# Patient Record
Sex: Male | Born: 1965 | Race: White | Hispanic: No | Marital: Married | Smoking: Never smoker
Health system: Southern US, Community
[De-identification: ages and names within clinical notes are randomized; demographics above are authoritative.]

## PROBLEM LIST (undated history)

## (undated) DIAGNOSIS — I10 Essential (primary) hypertension: Secondary | ICD-10-CM

## (undated) HISTORY — PX: NO PAST SURGERIES: SHX2092

## (undated) HISTORY — DX: Essential (primary) hypertension: I10

---

## 2014-12-08 ENCOUNTER — Ambulatory Visit (INDEPENDENT_AMBULATORY_CARE_PROVIDER_SITE_OTHER): Payer: Managed Care, Other (non HMO) | Admitting: Sports Medicine

## 2014-12-08 ENCOUNTER — Encounter: Payer: Self-pay | Admitting: Sports Medicine

## 2014-12-08 ENCOUNTER — Ambulatory Visit (INDEPENDENT_AMBULATORY_CARE_PROVIDER_SITE_OTHER): Payer: Managed Care, Other (non HMO)

## 2014-12-08 VITALS — BP 138/85 | HR 77 | Wt 213.0 lb

## 2014-12-08 DIAGNOSIS — M545 Low back pain, unspecified: Secondary | ICD-10-CM

## 2014-12-08 DIAGNOSIS — M546 Pain in thoracic spine: Secondary | ICD-10-CM | POA: Diagnosis not present

## 2014-12-08 DIAGNOSIS — M4806 Spinal stenosis, lumbar region: Secondary | ICD-10-CM | POA: Diagnosis not present

## 2014-12-08 DIAGNOSIS — M5136 Other intervertebral disc degeneration, lumbar region: Secondary | ICD-10-CM | POA: Insufficient documentation

## 2014-12-08 DIAGNOSIS — M51369 Other intervertebral disc degeneration, lumbar region without mention of lumbar back pain or lower extremity pain: Secondary | ICD-10-CM | POA: Insufficient documentation

## 2014-12-08 MED ORDER — MELOXICAM 15 MG PO TABS: ORAL_TABLET | ORAL | Status: DC

## 2014-12-08 MED ORDER — PREDNISONE 50 MG PO TABS: ORAL_TABLET | ORAL | Status: DC

## 2014-12-08 NOTE — Assessment & Plan Note (Signed)
Discogenic type back pain.  No red flags We will start conservatively with x-rays, prednisone, meloxicam, home rehabilitation exercises. Return in one month, MRI for intervention if no better.

## 2014-12-08 NOTE — Progress Notes (Signed)
   Subjective:    I'm seeing this patient as a consultation for:  Shawn Dicker PA-C  CC: Low back pain  HPI: This is a pleasant 49 year old male, for years he's had pain he localizes in the midline of his low back, no radiation, worse with flexion, Valsalva. No bowel or bladder dysfunction, nothing radicular, no saddle numbness, no constitutional symptoms. Moderate, persistent.  Past medical history, Surgical history, Family history not pertinant except as noted below, Social history, Allergies, and medications have been entered into the medical record, reviewed, and no changes needed.   Review of Systems: No headache, visual changes, nausea, vomiting, diarrhea, constipation, dizziness, abdominal pain, skin rash, fevers, chills, night sweats, weight loss, swollen lymph nodes, body aches, joint swelling, muscle aches, chest pain, shortness of breath, mood changes, visual or auditory hallucinations.   Objective:   General: Well Developed, well nourished, and in no acute distress.  Neuro/Psych: Alert and oriented x3, extra-ocular muscles intact, able to move all 4 extremities, sensation grossly intact. Skin: Warm and dry, no rashes noted.  Respiratory: Not using accessory muscles, speaking in full sentences, trachea midline.  Cardiovascular: Pulses palpable, no extremity edema. Abdomen: Does not appear distended. Back Exam:  Inspection: Unremarkable  Motion: Flexion 45 deg, Extension 45 deg, Side Bending to 45 deg bilaterally,  Rotation to 45 deg bilaterally  SLR laying: Negative  XSLR laying: Negative  Palpable tenderness: None. FABER: negative. Sensory change: Gross sensation intact to all lumbar and sacral dermatomes.  Reflexes: 2+ at both patellar tendons, 2+ at achilles tendons, Babinski's downgoing.  Strength at foot  Plantar-flexion: 5/5 Dorsi-flexion: 5/5 Eversion: 5/5 Inversion: 5/5  Leg strength  Quad: 5/5 Hamstring: 5/5 Hip flexor: 5/5 Hip abductors: 5/5  Gait  unremarkable.  Impression and Recommendations:   This case required medical decision making of moderate complexity.

## 2015-01-05 ENCOUNTER — Ambulatory Visit (INDEPENDENT_AMBULATORY_CARE_PROVIDER_SITE_OTHER): Payer: Managed Care, Other (non HMO) | Admitting: Sports Medicine

## 2015-01-05 ENCOUNTER — Encounter: Payer: Self-pay | Admitting: Sports Medicine

## 2015-01-05 VITALS — BP 132/89 | HR 85 | Ht 67.0 in | Wt 207.0 lb

## 2015-01-05 DIAGNOSIS — M545 Low back pain, unspecified: Secondary | ICD-10-CM

## 2015-01-05 NOTE — Assessment & Plan Note (Signed)
Essentially resolved with prednisone, has not yet done the rehabilitation exercises are taken the meloxicam.  Return as needed.

## 2015-01-05 NOTE — Progress Notes (Signed)
  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 He is happy with how things are going so far and will call us back if he has any worsening of symptoms.  Past medical history, Surgical history, Family history not pertinant except as noted below, Social history, Allergies, and medications have been entered into the medical record, reviewed, and no changes needed.   Review of Systems: No fevers, chills, night sweats, weight loss, chest pain, or shortness of breath.   Objective:    General: Well Developed, well nourished, and in no acute distress.  Neuro: Alert and oriented x3, extra-ocular muscles intact, sensation grossly intact.  HEENT: Normocephalic, atraumatic, pupils equal round reactive to light, neck supple, no masses, no lymphadenopathy, thyroid nonpalpable.  Skin: Warm and dry, no rashes. Cardiac: Regular rate and rhythm, no murmurs rubs or gallops, no lower extremity edema.  Respiratory: Clear to auscultation bilaterally. Not using accessory muscles, speaking in full sentences. Back Exam:  Inspection: Unremarkable  Motion: Flexion 45 deg, Extension 45 deg, Side Bending to 45 deg bilaterally,  Rotation to 45 deg bilaterally  SLR laying: Negative  XSLR laying: Negative  Palpable tenderness: None. FABER: negative. Sensory change: Gross sensation intact to all lumbar and sacral dermatomes.  Reflexes: 2+ at both patellar tendons, 2+ at achilles tendons, Babinski's downgoing.  Strength at foot  Plantar-flexion: 5/5 Dorsi-flexion: 5/5 Eversion: 5/5 Inversion: 5/5  Leg strength  Quad: 5/5 Hamstring: 5/5 Hip flexor: 5/5 Hip abductors: 5/5  Gait unremarkable.  Impression and Recommendations:

## 2017-04-21 IMAGING — CR DG THORACIC SPINE 3V
4 series · 4 of 4 positions shown · non-contrast
Comparison: None in PACs

CLINICAL DATA: Mid lower back pain for many years without known
injury

EXAM:
THORACIC SPINE - 3 VIEWS

[t-spine ap]
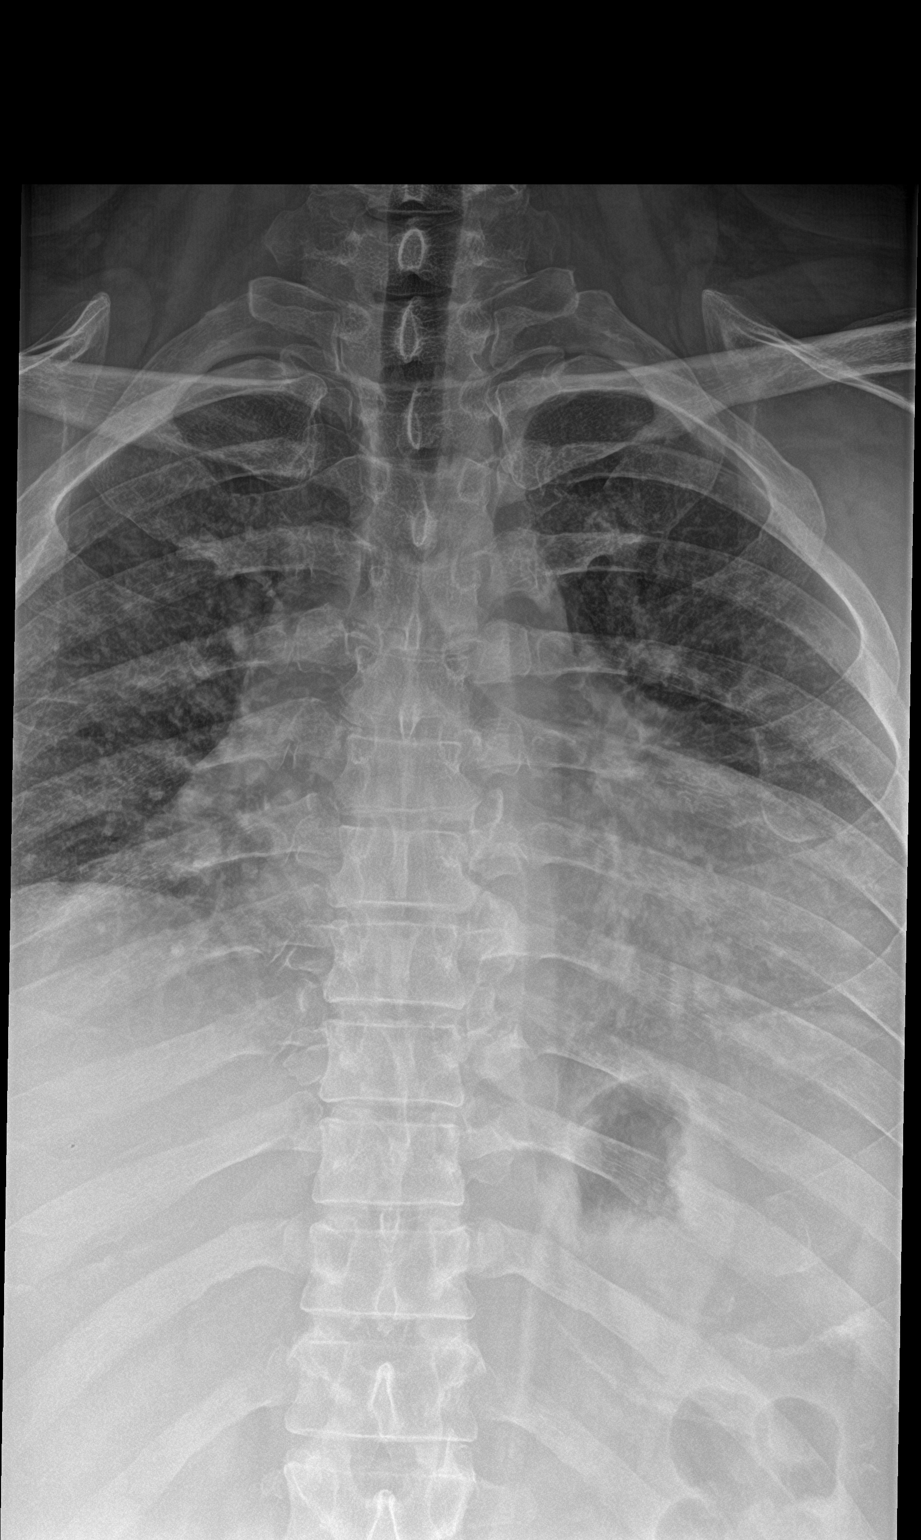

[t-spine lat]
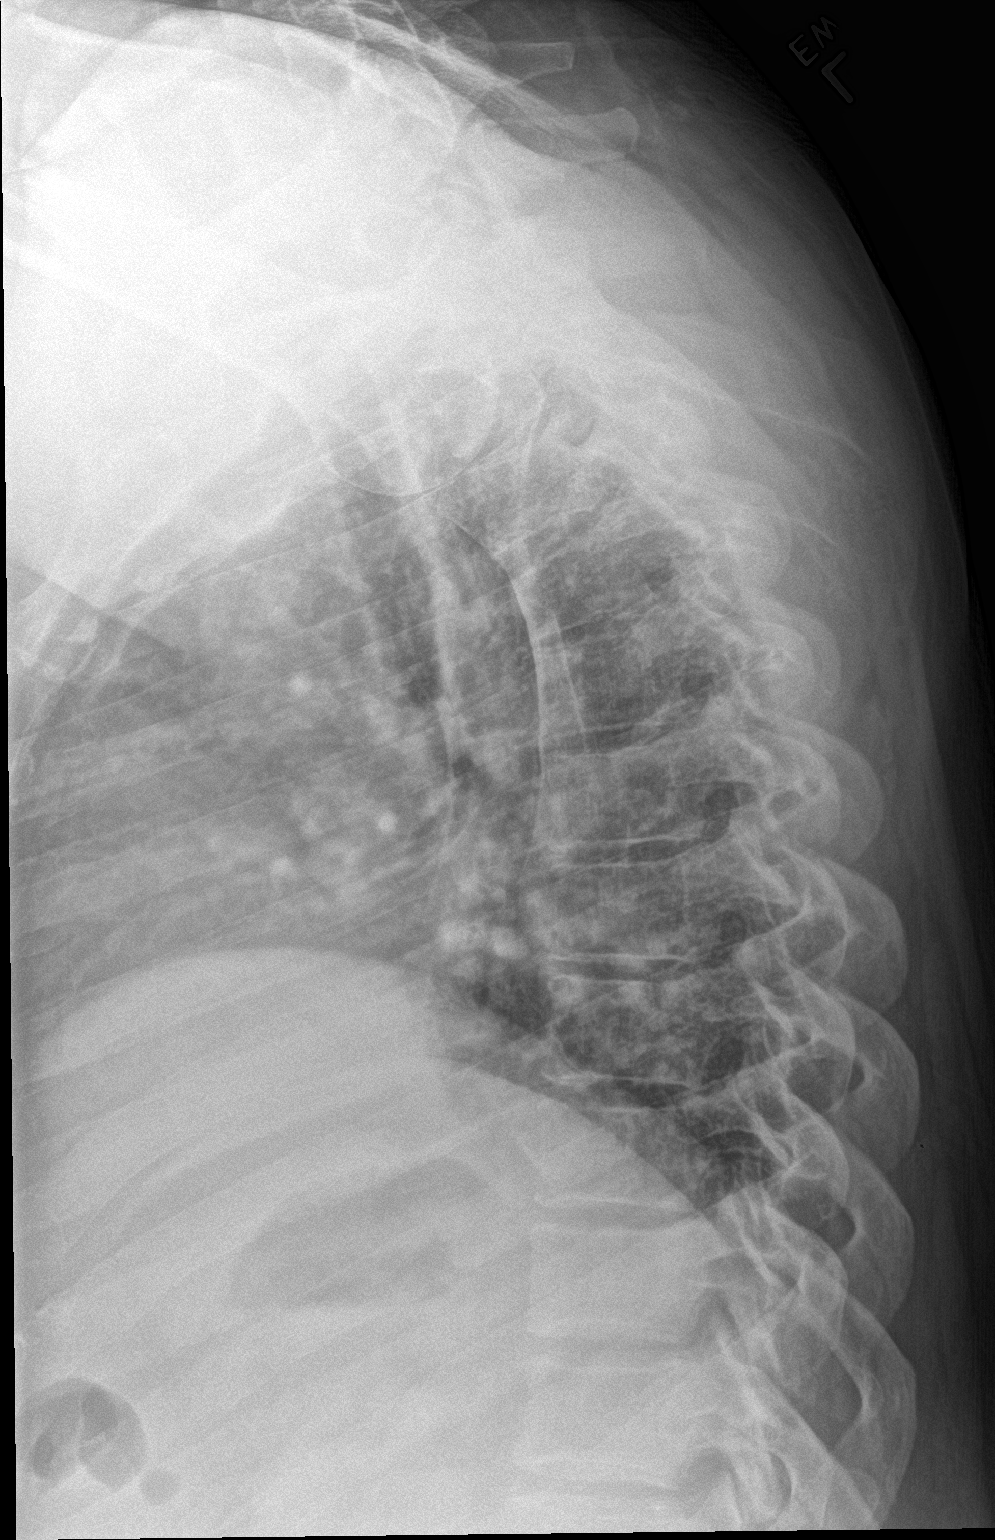

[t-spine swimmers (1 of 2)]
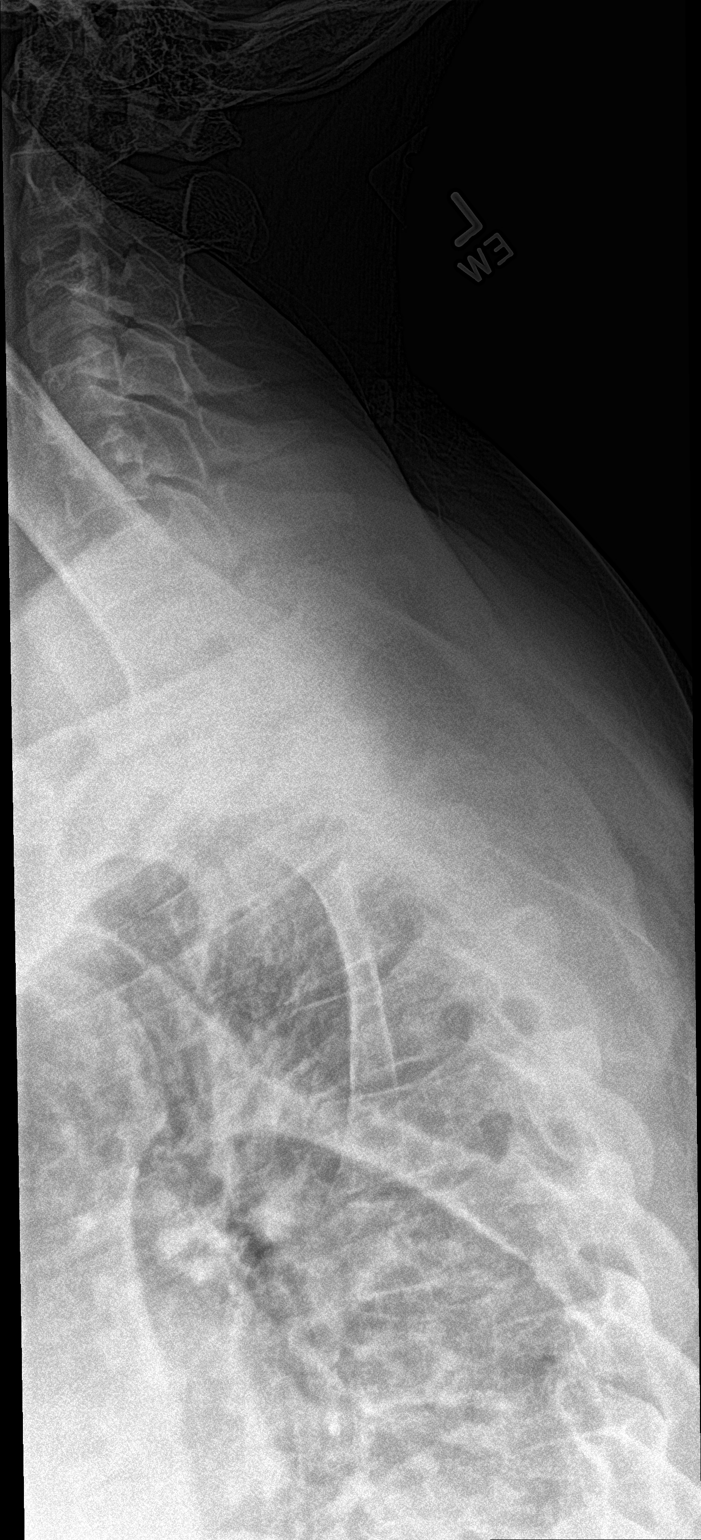

[t-spine swimmers (2 of 2)]
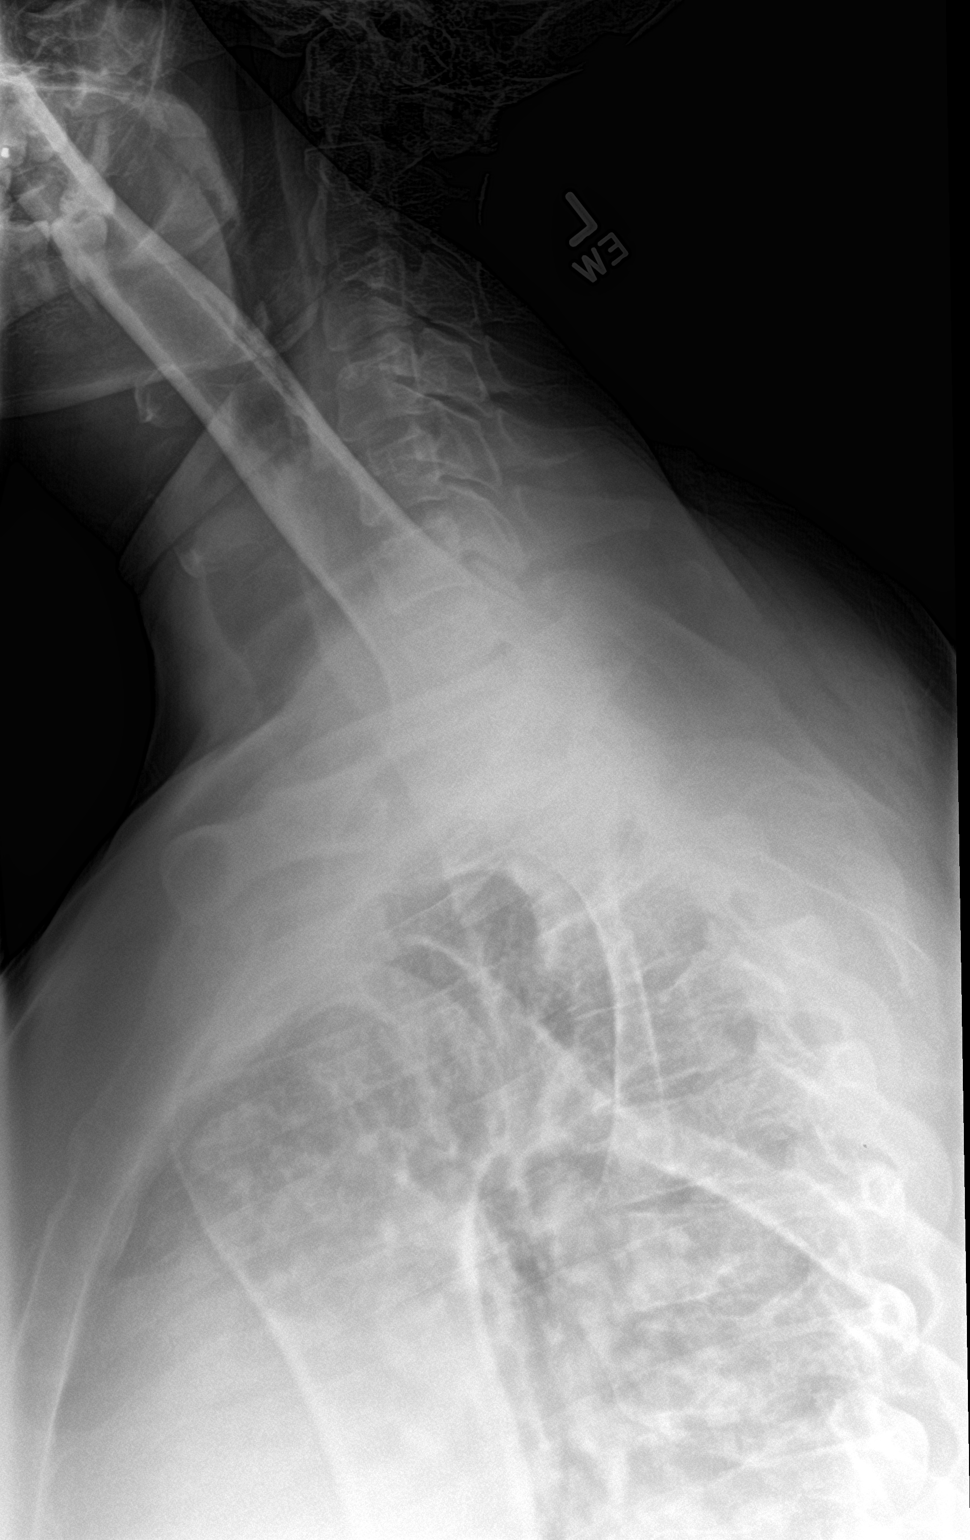

[4 of 4 positions shown; findings below may reference images not displayed]

FINDINGS: The thoracic vertebral bodies are preserved in height. The disc
space heights are reasonably well-maintained. Limited visualization
of the cervicothoracic junction reveals no acute abnormality. There
are no abnormal paravertebral soft tissue densities. The pedicles
are intact.
IMPRESSION: There is no acute or significant chronic bony abnormality of the
thoracic spine.

## 2020-10-28 ENCOUNTER — Ambulatory Visit: Payer: Managed Care, Other (non HMO) | Admitting: Sports Medicine

## 2020-10-28 ENCOUNTER — Ambulatory Visit (INDEPENDENT_AMBULATORY_CARE_PROVIDER_SITE_OTHER): Payer: Managed Care, Other (non HMO)

## 2020-10-28 ENCOUNTER — Other Ambulatory Visit: Payer: Self-pay

## 2020-10-28 DIAGNOSIS — M5136 Other intervertebral disc degeneration, lumbar region: Secondary | ICD-10-CM

## 2020-10-28 DIAGNOSIS — G5621 Lesion of ulnar nerve, right upper limb: Secondary | ICD-10-CM | POA: Diagnosis not present

## 2020-10-28 DIAGNOSIS — M51369 Other intervertebral disc degeneration, lumbar region without mention of lumbar back pain or lower extremity pain: Secondary | ICD-10-CM

## 2020-10-28 NOTE — Progress Notes (Signed)
    Procedures performed today:    None.  Independent interpretation of notes and tests performed by another provider:   None.  Brief History, Exam, Impression, and Recommendations:    Lumbar degenerative disc disease Shawn Gilbert is a very pleasant 55 year old male, he has a long history of chronic low back pain, I last saw him in 2016, we treated him conservatively for axial discogenic back pain, he responded well to conditioning exercises. More recently he is having an increase in pain, worse with sitting, flexion, Valsalva. No red flag symptoms, nothing radicular. 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 Updated x-rays. Return to see me 4 weeks as needed at which point we will try prescription strength anti-inflammatory.  Cubital tunnel syndrome, right In addition Shawn Gilbert has been waking up with numbness in his ring finger. This is classically cubital tunnel syndrome, symptoms are minimal, he will work on keeping his elbow straight, adding some ulnar nerve gliding exercises. If insufficient improvement after 4 to 6 weeks we will consider nighttime splinting in extension.    ___________________________________________ Ihor Austin. Benjamin Stain, M.D., ABFM., CAQSM. Primary Care and Sports Medicine [facility]  Adjunct Instructor of Family Medicine  [employer]

## 2020-10-28 NOTE — Assessment & Plan Note (Signed)
In addition Shawn Gilbert has been waking up with numbness in his ring finger. This is classically cubital tunnel syndrome, symptoms are minimal, he will work on keeping his elbow straight, adding some ulnar nerve gliding exercises. If insufficient improvement after 4 to 6 weeks we will consider nighttime splinting in extension.

## 2020-10-28 NOTE — Assessment & Plan Note (Signed)
Shawn Gilbert is a very pleasant 55 year old male, he has a long history of chronic low back pain, I last saw him in 2016, we treated him conservatively for axial discogenic back pain, he responded well to conditioning exercises. More recently he is having an increase in pain, worse with sitting, flexion, Valsalva. No red flag symptoms, nothing radicular. 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 Updated x-rays. Return to see me 4 weeks as needed at which point we will try prescription strength anti-inflammatory.

## 2020-12-09 ENCOUNTER — Other Ambulatory Visit: Payer: Self-pay

## 2020-12-09 ENCOUNTER — Ambulatory Visit (INDEPENDENT_AMBULATORY_CARE_PROVIDER_SITE_OTHER): Payer: Managed Care, Other (non HMO) | Admitting: Sports Medicine

## 2020-12-09 DIAGNOSIS — M5136 Other intervertebral disc degeneration, lumbar region: Secondary | ICD-10-CM | POA: Diagnosis not present

## 2020-12-09 DIAGNOSIS — G5621 Lesion of ulnar nerve, right upper limb: Secondary | ICD-10-CM | POA: Diagnosis not present

## 2020-12-09 DIAGNOSIS — M51369 Other intervertebral disc degeneration, lumbar region without mention of lumbar back pain or lower extremity pain: Secondary | ICD-10-CM

## 2020-12-09 NOTE — Assessment & Plan Note (Signed)
Shawn Gilbert has been waking up with numbness in his ring and little finger, classic cubital tunnel syndrome, symptoms continue be minimal, he really did not do any of the exercises, he feels like he can live with it for now. If recurrence of symptoms we will consider NSAIDs and extension splinting. Return as needed for this.

## 2020-12-09 NOTE — Assessment & Plan Note (Signed)
Long history of chronic low back pain, axial and discogenic worse with sitting, flexion, Valsalva without red flag symptoms, nothing radicular, at the last visit we discussed being cognizant of good form when lifting, we also added some conditioning exercises that he did not do. X-rays showed expected degenerative processes from L3-S1. Overall his pain is dull, he feels like he can live with it so we can see him back on an as-needed basis, cautioned about the risks of not pursuing core conditioning with his lumbar spine anatomy.

## 2020-12-09 NOTE — Progress Notes (Signed)
    Procedures performed today:    None.  Independent interpretation of notes and tests performed by another provider:   None.  Brief History, Exam, Impression, and Recommendations:    Cubital tunnel syndrome, right Kendan has been waking up with numbness in his ring and little finger, classic cubital tunnel syndrome, symptoms continue be minimal, he really did not do any of the exercises, he feels like he can live with it for now. If recurrence of symptoms we will consider NSAIDs and extension splinting. Return as needed for this.  Lumbar degenerative disc disease Long history of chronic low back pain, axial and discogenic worse with sitting, flexion, Valsalva without red flag symptoms, nothing radicular, at the last visit we discussed being cognizant of good form when lifting, we also added some conditioning exercises that he did not do. X-rays showed expected degenerative processes from L3-S1. Overall his pain is dull, he feels like he can live with it so we can see him back on an as-needed basis, cautioned about the risks of not pursuing core conditioning with his lumbar spine anatomy.    ___________________________________________ Shawn Gilbert. Benjamin Stain, M.D., ABFM., CAQSM. Primary Care and Sports Medicine  [facility]  Adjunct Instructor of Family Medicine  [employer]

## 2023-12-04 ENCOUNTER — Encounter: Payer: Self-pay | Admitting: Sports Medicine
